# Patient Record
Sex: Female | Born: 1935 | Race: White | Hispanic: No
Health system: Southern US, Community
[De-identification: ages and names within clinical notes are randomized; demographics above are authoritative.]

## PROBLEM LIST (undated history)

## (undated) DIAGNOSIS — N183 Chronic kidney disease, stage 3 unspecified: Secondary | ICD-10-CM

## (undated) DIAGNOSIS — J9621 Acute and chronic respiratory failure with hypoxia: Secondary | ICD-10-CM

## (undated) DIAGNOSIS — J449 Chronic obstructive pulmonary disease, unspecified: Secondary | ICD-10-CM

---

## 2021-01-21 ENCOUNTER — Other Ambulatory Visit (HOSPITAL_COMMUNITY): Payer: Medicare Other

## 2021-01-21 ENCOUNTER — Inpatient Hospital Stay
Admit: 2021-01-21 | Discharge: 2021-02-22 | Disposition: A | Discharge status: Skilled Nursing Facility | Payer: Medicare Other | Source: Ambulatory Visit | Attending: Internal Medicine | Admitting: Internal Medicine

## 2021-01-21 DIAGNOSIS — R109 Unspecified abdominal pain: Secondary | ICD-10-CM

## 2021-01-21 DIAGNOSIS — D72829 Elevated white blood cell count, unspecified: Secondary | ICD-10-CM

## 2021-01-21 DIAGNOSIS — Z452 Encounter for adjustment and management of vascular access device: Secondary | ICD-10-CM

## 2021-01-21 DIAGNOSIS — J9621 Acute and chronic respiratory failure with hypoxia: Secondary | ICD-10-CM | POA: Diagnosis present

## 2021-01-21 DIAGNOSIS — N179 Acute kidney failure, unspecified: Secondary | ICD-10-CM

## 2021-01-21 DIAGNOSIS — J449 Chronic obstructive pulmonary disease, unspecified: Secondary | ICD-10-CM | POA: Diagnosis present

## 2021-01-21 DIAGNOSIS — Z4659 Encounter for fitting and adjustment of other gastrointestinal appliance and device: Secondary | ICD-10-CM

## 2021-01-21 DIAGNOSIS — N183 Chronic kidney disease, stage 3 unspecified: Secondary | ICD-10-CM | POA: Diagnosis present

## 2021-01-21 HISTORY — DX: Chronic kidney disease, stage 3 unspecified: N18.30

## 2021-01-21 HISTORY — DX: Traumatic subdural hemorrhage with loss of consciousness of any duration with death due to brain injury before regaining consciousness, initial encounter: S06.5X7A

## 2021-01-21 HISTORY — DX: Chronic obstructive pulmonary disease, unspecified: J44.9

## 2021-01-21 HISTORY — DX: Acute and chronic respiratory failure with hypoxia: J96.21

## 2021-01-22 LAB — COMPREHENSIVE METABOLIC PANEL
ALT: 21 U/L (ref 0–44)
AST: 20 U/L (ref 15–41)
Albumin: 2.6 g/dL — ABNORMAL LOW (ref 3.5–5.0)
Alkaline Phosphatase: 42 U/L (ref 38–126)
Anion gap: 8 (ref 5–15)
BUN: 41 mg/dL — ABNORMAL HIGH (ref 8–23)
CO2: 32 mmol/L (ref 22–32)
Calcium: 9.8 mg/dL (ref 8.9–10.3)
Chloride: 104 mmol/L (ref 98–111)
Creatinine, Ser: 0.74 mg/dL (ref 0.44–1.00)
GFR, Estimated: >60 mL/min (ref >60)
Glucose, Bld: 108 mg/dL — ABNORMAL HIGH (ref 70–99)
Potassium: 3.8 mmol/L (ref 3.5–5.1)
Sodium: 144 mmol/L (ref 135–145)
Total Bilirubin: 0.7 mg/dL (ref 0.3–1.2)
Total Protein: 5.6 g/dL — ABNORMAL LOW (ref 6.5–8.1)

## 2021-01-22 LAB — CBC
HCT: 26.7 % — ABNORMAL LOW (ref 36.0–46.0)
Hemoglobin: 8.2 g/dL — ABNORMAL LOW (ref 12.0–15.0)
MCH: 28.9 pg (ref 26.0–34.0)
MCHC: 30.7 g/dL (ref 30.0–36.0)
MCV: 94 fL (ref 80.0–100.0)
Platelets: 185 10*3/uL (ref 150–400)
RBC: 2.84 MIL/uL — ABNORMAL LOW (ref 3.87–5.11)
RDW: 15.1 % (ref 11.5–15.5)
WBC: 6.7 10*3/uL (ref 4.0–10.5)
nRBC: 0 % (ref 0.0–0.2)

## 2021-01-22 LAB — PROTIME-INR
INR: 1.1 (ref 0.8–1.2)
Prothrombin Time: 14.2 seconds (ref 11.4–15.2)

## 2021-01-22 LAB — BLOOD GAS, ARTERIAL
Acid-Base Excess: 6.7 mmol/L — ABNORMAL HIGH (ref 0.0–2.0)
Bicarbonate: 31.4 mmol/L — ABNORMAL HIGH (ref 20.0–28.0)
FIO2: 40
O2 Saturation: 98.8 %
Patient temperature: 37
pCO2 arterial: 50.4 mmHg — ABNORMAL HIGH (ref 32.0–48.0)
pH, Arterial: 7.41 (ref 7.350–7.450)
pO2, Arterial: 124 mmHg — ABNORMAL HIGH (ref 83.0–108.0)

## 2021-01-22 MED ORDER — IOHEXOL 180 MG/ML  SOLN
50.0000 mL | Freq: Once | INTRAMUSCULAR | Status: AC | PRN
  Administered 2021-01-22: 50 mL

## 2021-01-23 ENCOUNTER — Encounter: Payer: Self-pay | Admitting: Internal Medicine

## 2021-01-23 DIAGNOSIS — J449 Chronic obstructive pulmonary disease, unspecified: Secondary | ICD-10-CM | POA: Diagnosis not present

## 2021-01-23 DIAGNOSIS — J9621 Acute and chronic respiratory failure with hypoxia: Secondary | ICD-10-CM | POA: Diagnosis not present

## 2021-01-23 DIAGNOSIS — N183 Chronic kidney disease, stage 3 unspecified: Secondary | ICD-10-CM | POA: Diagnosis not present

## 2021-01-23 NOTE — Consult Note (Signed)
Pulmonary Critical Care Medicine [facility] [place]  PULMONARY SERVICE  Date of Service: 01/23/2021  PULMONARY CRITICAL CARE CONSULT   Katie Wade  MCN:[id number]  DOB: 10/08/1933   DOA: 01/21/2021  Referring Physician: Carron Curie, MD  HPI: Katie Wade is a 87 y.o. female seen for follow up of Acute on Chronic Respiratory Failure.  Patient has multiple medical problems as noted below presented to the hospital after she was found to have a large right-sided subdural hemorrhage after sustaining a fall.  Patient had apparently been on Xarelto for anticoagulation and fell in the morning but at that time looked okay in the afternoon however patient was not able to be aroused and therefore ambulance was called.  Patient was admitted into the surgical ICU neurosurgery was consultative taken to the OR had a craniotomy done and evacuation of subdural hematoma.  Postoperatively patient had a prolonged and complicated course requiring ongoing mechanical ventilation.  Patient remained intubated and subsequently had a tracheostomy done for failure to wean.  Now patient presents to our facility for further management and weaning is on pressure support mode at the time of evaluation.  Review of Systems:  ROS performed and is unremarkable other than noted above.  Past Medical History:  Diagnosis Date  . Arthritis  . Asthma  . Breast cancer (HCC)  left breast  . Breast cancer (HCC)  . CHF (congestive heart failure) (HCC)  . COPD (chronic obstructive pulmonary disease) (HCC)  2 liters O2 at night/ and PRN  . Depression  . Diabetes mellitus (HCC)  diet controlled  . Dysthymic disorder  . Hiatal hernia  . History of cancer chemotherapy 2015  could not tolerate  . History of complications due to general anesthesia  Harder time/longer to wake up with breast biopsy (patient's grandson mentions propofol)  . History of radiation therapy  breast left  . Hx of hiatal hernia  .  Hypertension  . Hypothyroidism  . Lower back pain  LEFT lumbar fracture at L1-2 per patient family  . Lower extremity neuropathy  . Obstructive sleep apnea  O2 at night at 2 liters, cannot tolerate CPAP mask  . Peripheral neuropathy  . Polyneuropathy  . Post-operative nausea and vomiting  . PUD (peptic ulcer disease)  . Thromboembolism (HCC) 1968  with pregnancy- chest   Past Surgical History:  Procedure Laterality Date  . HX BREAST LUMPECTOMY FOR CANCER Left 2015  . HX BREAST SURGERY - OTHER Left  biospy  . HX CATARACT REMOVAL Bilateral  . HX CORE NEEDLE BREAST BIOPSY  . HX EXCISIONAL BIOPSY  . HX HYSTERECTOMY  . HX KNEE REPLACMENT Right  . HX SPINAL SURGERY  1960  . HX SURGICAL OTHER  Left shoulder, right knee, Biliteral eyes  . HX SURGICAL OTHER left arm due to breast cancer  . HX THYROIDECTOMY 1960   (Not in a hospital admission)  Allergies  Allergen Reactions  . Codeine Other (See Comments)  Clarified allergy with patient and family. It's unclear what the codeine reaction was, but she has tolerated hydrocodone, morphine and hydromorphone without incident.  . Nitrofurantoin Monohyd/M-Cryst Confusion, Fever, Hallucination, Muscle Pain, Nausea and Vomiting and Syncope   Social History   Tobacco Use  . Smoking status: Never Smoker  . Smokeless tobacco: Never Used  Substance Use Topics  . Alcohol use: No   Family History  Problem Relation Name Age of Onset  . Cancer-Breast Mother  around her 17's  . Diabetes Mother  . COPD  Grandchild       Medications: Reviewed on Rounds  Physical Exam:  Vitals: Temperature is 98.6 pulse 58 respiratory 21 blood pressure is 152/72 saturations 99%  Ventilator Settings patient currently is on pressure support wean pressure support of 12/5 FiO2 40%  . General: Comfortable at this time . Eyes: Grossly normal lids, irises & conjunctiva . ENT: grossly tongue is normal . Neck: no obvious mass . Cardiovascular: S1-S2  normal no gallop or rub . Respiratory: Scattered rhonchi expansion is equal . Abdomen: Soft and nontender . Skin: no rash seen on limited exam . Musculoskeletal: not rigid . Psychiatric:unable to assess . Neurologic: no seizure no involuntary movements         Labs on Admission:  Basic Metabolic Panel: Recent Labs  Lab 01/22/21 1350  NA 144  K 3.8  CL 104  CO2 32  GLUCOSE 108*  BUN 41*  CREATININE 0.74  CALCIUM 9.8    Recent Labs  Lab 01/22/21 0415  PHART 7.410  PCO2ART 50.4*  PO2ART 124*  HCO3 31.4*  O2SAT 98.8    Liver Function Tests: Recent Labs  Lab 01/22/21 1350  AST 20  ALT 21  ALKPHOS 42  BILITOT 0.7  PROT 5.6*  ALBUMIN 2.6*   No results for input(s): LIPASE, AMYLASE in the last 168 hours. No results for input(s): AMMONIA in the last 168 hours.  CBC: Recent Labs  Lab 01/22/21 1350  WBC 6.7  HGB 8.2*  HCT 26.7*  MCV 94.0  PLT 185    Cardiac Enzymes: No results for input(s): CKTOTAL, CKMB, CKMBINDEX, TROPONINI in the last 168 hours.  BNP (last 3 results) No results for input(s): BNP in the last 8760 hours.  ProBNP (last 3 results) No results for input(s): PROBNP in the last 8760 hours.   Radiological Exams on Admission: DG ABDOMEN PEG TUBE LOCATION  Result Date: 01/22/2021 CLINICAL DATA:  Peg tube placement EXAM: ABDOMEN - 1 VIEW COMPARISON:  None. FINDINGS: Frontal view of the lower chest and upper abdomen excludes the pelvis and right flank by collimation. Percutaneous gastrostomy tube overlies the left upper quadrant. Contrast administered by the clinician outlines the catheter tubing and the gastric rugal folds within the fundus. No evidence of contrast extravasation. No bowel obstruction. IMPRESSION: 1. Percutaneous gastrostomy tube within the gastric lumen. No contrast extravasation. Electronically Signed   By: Sharlet Salina M.D.   On: 01/22/2021 01:16   DG CHEST PORT 1 VIEW  Result Date: 01/22/2021 CLINICAL DATA:  PICC line  EXAM: PORTABLE CHEST 1 VIEW COMPARISON:  None. FINDINGS: Single frontal view of the chest demonstrates tracheostomy tube overlying tracheal air column tip at level of thoracic inlet. There is a right-sided PICC tip overlying the superior vena cava. Cardiac silhouette is unremarkable. No airspace disease, effusion, or pneumothorax. Surgical clips left axilla. IMPRESSION: 1. Right-sided PICC tip overlying superior vena cava. 2. No acute intrathoracic process. Electronically Signed   By: Sharlet Salina M.D.   On: 01/22/2021 01:17    Assessment/Plan Active Problems:   Acute on chronic respiratory failure with hypoxia (HCC)   Traumatic subdural hematoma with loss of consciousness of any duration with death due to brain injury before regaining consciousness   COPD, severe (HCC)   Chronic kidney disease, stage III (moderate) (HCC)   1. Acute on chronic respiratory failure hypoxia the plan is going to be to continue with the pressure support wean on 12/5.  The goal is for 4 hours at this time.  Patient appears  to be tolerating it fairly well. 2. Traumatic subdural hematoma patient is going to be followed by physical therapy will continue with therapy as tolerated. 3. Severe COPD medical management patient will need nebulizers as necessary. 4. Chronic kidney disease stage III moderate by severity plan is going to be to monitor the patient's fluid status closely.  I have personally seen and evaluated the patient, evaluated laboratory and imaging results, formulated the assessment and plan and placed orders. The Patient requires high complexity decision making with multiple systems involvement.  Case was discussed on Rounds with the Respiratory Therapy Director and the Respiratory staff Time Spent  Yevonne Pax, MD [facility] Pulmonary Critical Care Medicine Sleep Medicine

## 2021-01-24 DIAGNOSIS — J449 Chronic obstructive pulmonary disease, unspecified: Secondary | ICD-10-CM | POA: Diagnosis not present

## 2021-01-24 DIAGNOSIS — N183 Chronic kidney disease, stage 3 unspecified: Secondary | ICD-10-CM | POA: Diagnosis not present

## 2021-01-24 DIAGNOSIS — J9621 Acute and chronic respiratory failure with hypoxia: Secondary | ICD-10-CM | POA: Diagnosis not present

## 2021-01-24 LAB — BASIC METABOLIC PANEL
Anion gap: 9 (ref 5–15)
BUN: 45 mg/dL — ABNORMAL HIGH (ref 8–23)
CO2: 31 mmol/L (ref 22–32)
Calcium: 10.3 mg/dL (ref 8.9–10.3)
Chloride: 102 mmol/L (ref 98–111)
Creatinine, Ser: 0.58 mg/dL (ref 0.44–1.00)
GFR, Estimated: >60 mL/min (ref >60)
Glucose, Bld: 125 mg/dL — ABNORMAL HIGH (ref 70–99)
Potassium: 4.1 mmol/L (ref 3.5–5.1)
Sodium: 142 mmol/L (ref 135–145)

## 2021-01-24 LAB — CBC
HCT: 27.4 % — ABNORMAL LOW (ref 36.0–46.0)
Hemoglobin: 8.2 g/dL — ABNORMAL LOW (ref 12.0–15.0)
MCH: 28.3 pg (ref 26.0–34.0)
MCHC: 29.9 g/dL — ABNORMAL LOW (ref 30.0–36.0)
MCV: 94.5 fL (ref 80.0–100.0)
Platelets: 173 10*3/uL (ref 150–400)
RBC: 2.9 MIL/uL — ABNORMAL LOW (ref 3.87–5.11)
RDW: 15.2 % (ref 11.5–15.5)
WBC: 7 10*3/uL (ref 4.0–10.5)
nRBC: 0 % (ref 0.0–0.2)

## 2021-01-24 NOTE — Progress Notes (Signed)
Pulmonary Critical Care Medicine [facility] [place]   PULMONARY CRITICAL CARE SERVICE  PROGRESS NOTE  Date of Service: 01/24/2021  Katie Wade  JME:[id number]  DOB: [birth year]   DOA: 01/21/2021  Referring Physician: Carron Curie, MD  HPI: Katie Wade is a 87 y.o. female seen for follow up of Acute on Chronic Respiratory Failure.  Weaning on protocol today is 8-hour goal on pressure support  Medications: Reviewed on Rounds  Physical Exam:  Vitals: Temperature is 98.6 pulse 77 respiratory rate is 18 blood pressure 153/79 saturations 100%  Ventilator Settings on pressure support FiO2 40% pressure 12/5  . General: Comfortable at this time . Eyes: Grossly normal lids, irises & conjunctiva . ENT: grossly tongue is normal . Neck: no obvious mass . Cardiovascular: S1 S2 normal no gallop . Respiratory: No rhonchi very coarse breath sounds . Abdomen: soft . Skin: no rash seen on limited exam . Musculoskeletal: not rigid . Psychiatric:unable to assess . Neurologic: no seizure no involuntary movements         Lab Data:   Basic Metabolic Panel: Recent Labs  Lab 01/22/21 1350 01/24/21 0800  NA 144 142  K 3.8 4.1  CL 104 102  CO2 32 31  GLUCOSE 108* 125*  BUN 41* 45*  CREATININE 0.74 0.58  CALCIUM 9.8 10.3    ABG: Recent Labs  Lab 01/22/21 0415  PHART 7.410  PCO2ART 50.4*  PO2ART 124*  HCO3 31.4*  O2SAT 98.8    Liver Function Tests: Recent Labs  Lab 01/22/21 1350  AST 20  ALT 21  ALKPHOS 42  BILITOT 0.7  PROT 5.6*  ALBUMIN 2.6*   No results for input(s): LIPASE, AMYLASE in the last 168 hours. No results for input(s): AMMONIA in the last 168 hours.  CBC: Recent Labs  Lab 01/22/21 1350 01/24/21 0800  WBC 6.7 7.0  HGB 8.2* 8.2*  HCT 26.7* 27.4*  MCV 94.0 94.5  PLT 185 173    Cardiac Enzymes: No results for input(s): CKTOTAL, CKMB, CKMBINDEX, TROPONINI in the last 168 hours.  BNP (last 3 results) No results for input(s):  BNP in the last 8760 hours.  ProBNP (last 3 results) No results for input(s): PROBNP in the last 8760 hours.  Radiological Exams: No results found.  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 Acute on chronic respiratory failure hypoxia we will continue with pressure support wean as tolerated. 2. Traumatic subdural hematoma no change we will continue to monitor 3. Severe COPD medical management 4. Chronic kidney disease stage III we will continue with supportive care   I have personally seen and evaluated the patient, evaluated laboratory and imaging results, formulated the assessment and plan and placed orders. The Patient requires high complexity decision making with multiple systems involvement.  Rounds were done with the Respiratory Therapy Director and Staff therapists and discussed with nursing staff also.  Yevonne Pax, MD [facility] Pulmonary Critical Care Medicine Sleep Medicine

## 2021-01-25 DIAGNOSIS — J449 Chronic obstructive pulmonary disease, unspecified: Secondary | ICD-10-CM | POA: Diagnosis not present

## 2021-01-25 DIAGNOSIS — N183 Chronic kidney disease, stage 3 unspecified: Secondary | ICD-10-CM | POA: Diagnosis not present

## 2021-01-25 DIAGNOSIS — J9621 Acute and chronic respiratory failure with hypoxia: Secondary | ICD-10-CM | POA: Diagnosis not present

## 2021-01-26 DIAGNOSIS — J9621 Acute and chronic respiratory failure with hypoxia: Secondary | ICD-10-CM | POA: Diagnosis not present

## 2021-01-26 DIAGNOSIS — N183 Chronic kidney disease, stage 3 unspecified: Secondary | ICD-10-CM | POA: Diagnosis not present

## 2021-01-26 DIAGNOSIS — J449 Chronic obstructive pulmonary disease, unspecified: Secondary | ICD-10-CM | POA: Diagnosis not present

## 2021-01-26 NOTE — Progress Notes (Signed)
Pulmonary Critical Care Medicine [facility] [place]   PULMONARY CRITICAL CARE SERVICE  PROGRESS NOTE  Date of Service: 01/26/2021   Katie Wade  OXB:[id number]  DOB: 04/20/1933   DOA: 01/21/2021  Referring Physician: Carron Curie, MD  HPI: Katie Wade is a 87 y.o. female seen for follow up of Acute on Chronic Respiratory Failure.  Patient is on pressure support goal of 16 hours  Medications: Reviewed on Rounds  Physical Exam:  Vitals: Temperature is 98.7 pulse 59 respiratory rate is 14 blood pressure is 133/65 saturations 100%  Ventilator Settings on pressure support FiO2 40% pressure 12/5  . General: Comfortable at this time . Eyes: Grossly normal lids, irises & conjunctiva . ENT: grossly tongue is normal . Neck: no obvious mass . Cardiovascular: S1 S2 normal no gallop . Respiratory: No rhonchi no rales are noted at this time . Abdomen: soft . Skin: no rash seen on limited exam . Musculoskeletal: not rigid . Psychiatric:unable to assess . Neurologic: no seizure no involuntary movements         Lab Data:   Basic Metabolic Panel: Recent Labs  Lab 01/22/21 1350 01/24/21 0800  NA 144 142  K 3.8 4.1  CL 104 102  CO2 32 31  GLUCOSE 108* 125*  BUN 41* 45*  CREATININE 0.74 0.58  CALCIUM 9.8 10.3    ABG: Recent Labs  Lab 01/22/21 0415  PHART 7.410  PCO2ART 50.4*  PO2ART 124*  HCO3 31.4*  O2SAT 98.8    Liver Function Tests: Recent Labs  Lab 01/22/21 1350  AST 20  ALT 21  ALKPHOS 42  BILITOT 0.7  PROT 5.6*  ALBUMIN 2.6*   No results for input(s): LIPASE, AMYLASE in the last 168 hours. No results for input(s): AMMONIA in the last 168 hours.  CBC: Recent Labs  Lab 01/22/21 1350 01/24/21 0800  WBC 6.7 7.0  HGB 8.2* 8.2*  HCT 26.7* 27.4*  MCV 94.0 94.5  PLT 185 173    Cardiac Enzymes: No results for input(s): CKTOTAL, CKMB, CKMBINDEX, TROPONINI in the last 168 hours.  BNP (last 3 results) No results for input(s):  BNP in the last 8760 hours.  ProBNP (last 3 results) No results for input(s): PROBNP in the last 8760 hours.  Radiological Exams: No results found.  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 Acute on chronic respiratory failure hypoxia we will continue with pressure support titrate oxygen continue pulmonary toilet. 2. Traumatic subdural hematoma no change we will continue to monitor closely 3. Severe COPD at baseline 4. Chronic kidney disease stage III we will continue with supportive care   I have personally seen and evaluated the patient, evaluated laboratory and imaging results, formulated the assessment and plan and placed orders. The Patient requires high complexity decision making with multiple systems involvement.  Rounds were done with the Respiratory Therapy Director and Staff therapists and discussed with nursing staff also.  Yevonne Pax, MD [facility] Pulmonary Critical Care Medicine Sleep Medicine

## 2021-01-26 NOTE — Progress Notes (Signed)
Pulmonary Critical Care Medicine [facility] [place]   PULMONARY CRITICAL CARE SERVICE  PROGRESS NOTE    Katie Wade  IDP:[id number]  DOB: [birth year]   DOA: 01/21/2021  Referring Physician: Carron Curie, MD  HPI: Katie Wade is a 87 y.o. female seen for follow up of Acute on Chronic Respiratory Failure.  Patient is on full support on assist control has been on 40% FiO2  Medications: Reviewed on Rounds  Physical Exam:  Vitals: Temperature is 97.8 pulse 50 respiratory rate is 16 blood pressure is 136/64 saturations 99%  Ventilator Settings on assist control FiO2 40% tidal volume 450 PEEP 5  . General: Comfortable at this time . Eyes: Grossly normal lids, irises & conjunctiva . ENT: grossly tongue is normal . Neck: no obvious mass . Cardiovascular: S1 S2 normal no gallop . Respiratory: Scattered rhonchi expansion is equal at this time . Abdomen: soft . Skin: no rash seen on limited exam . Musculoskeletal: not rigid . Psychiatric:unable to assess . Neurologic: no seizure no involuntary movements         Lab Data:   Basic Metabolic Panel: Recent Labs  Lab 01/22/21 1350 01/24/21 0800  NA 144 142  K 3.8 4.1  CL 104 102  CO2 32 31  GLUCOSE 108* 125*  BUN 41* 45*  CREATININE 0.74 0.58  CALCIUM 9.8 10.3    ABG: Recent Labs  Lab 01/22/21 0415  PHART 7.410  PCO2ART 50.4*  PO2ART 124*  HCO3 31.4*  O2SAT 98.8    Liver Function Tests: Recent Labs  Lab 01/22/21 1350  AST 20  ALT 21  ALKPHOS 42  BILITOT 0.7  PROT 5.6*  ALBUMIN 2.6*   No results for input(s): LIPASE, AMYLASE in the last 168 hours. No results for input(s): AMMONIA in the last 168 hours.  CBC: Recent Labs  Lab 01/22/21 1350 01/24/21 0800  WBC 6.7 7.0  HGB 8.2* 8.2*  HCT 26.7* 27.4*  MCV 94.0 94.5  PLT 185 173    Cardiac Enzymes: No results for input(s): CKTOTAL, CKMB, CKMBINDEX, TROPONINI in the last 168 hours.  BNP (last 3 results) No results for  input(s): BNP in the last 8760 hours.  ProBNP (last 3 results) No results for input(s): PROBNP in the last 8760 hours.  Radiological Exams: No results found.  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 Acute on chronic respiratory failure hypoxia plan is to continue with full support on the ventilator.  Right now not tolerating weaning respiratory therapy will check the RSB I mechanics. 2. Traumatic subdural hematoma no change we will continue with supportive care. 3. Severe COPD medical management 4. Chronic kidney disease stage III following lab   I have personally seen and evaluated the patient, evaluated laboratory and imaging results, formulated the assessment and plan and placed orders. The Patient requires high complexity decision making with multiple systems involvement.  Rounds were done with the Respiratory Therapy Director and Staff therapists and discussed with nursing staff also.  Yevonne Pax, MD [facility] Pulmonary Critical Care Medicine Sleep Medicine

## 2021-01-27 DIAGNOSIS — J9621 Acute and chronic respiratory failure with hypoxia: Secondary | ICD-10-CM | POA: Diagnosis not present

## 2021-01-27 DIAGNOSIS — J449 Chronic obstructive pulmonary disease, unspecified: Secondary | ICD-10-CM | POA: Diagnosis not present

## 2021-01-27 DIAGNOSIS — N183 Chronic kidney disease, stage 3 unspecified: Secondary | ICD-10-CM | POA: Diagnosis not present

## 2021-01-27 NOTE — Progress Notes (Signed)
Pulmonary Critical Care Medicine [facility] [place]   PULMONARY CRITICAL CARE SERVICE  PROGRESS NOTE  Date of Service: 01/27/2021   Katie Wade  FAO:[id number]  DOB: 1932-12-12   DOA: 01/21/2021  Referring Physician: Carron Curie, MD  HPI: Katie Wade is a 87 y.o. female seen for follow up of Acute on Chronic Respiratory Failure.  Patient is currently afebrile without distress pressure support supposed to wean on T collar today  Medications: Reviewed on Rounds  Physical Exam:  Vitals: Temperature is 98.5 pulse 64 respiratory 15 blood pressure is 107/48 saturations 100%  Ventilator Settings on T collar right now  . General: Comfortable at this time . Eyes: Grossly normal lids, irises & conjunctiva . ENT: grossly tongue is normal . Neck: no obvious mass . Cardiovascular: S1 S2 normal no gallop . Respiratory: No rhonchi very coarse breath sounds . Abdomen: soft . Skin: no rash seen on limited exam . Musculoskeletal: not rigid . Psychiatric:unable to assess . Neurologic: no seizure no involuntary movements         Lab Data:   Basic Metabolic Panel: Recent Labs  Lab 01/22/21 1350 01/24/21 0800  NA 144 142  K 3.8 4.1  CL 104 102  CO2 32 31  GLUCOSE 108* 125*  BUN 41* 45*  CREATININE 0.74 0.58  CALCIUM 9.8 10.3    ABG: Recent Labs  Lab 01/22/21 0415  PHART 7.410  PCO2ART 50.4*  PO2ART 124*  HCO3 31.4*  O2SAT 98.8    Liver Function Tests: Recent Labs  Lab 01/22/21 1350  AST 20  ALT 21  ALKPHOS 42  BILITOT 0.7  PROT 5.6*  ALBUMIN 2.6*   No results for input(s): LIPASE, AMYLASE in the last 168 hours. No results for input(s): AMMONIA in the last 168 hours.  CBC: Recent Labs  Lab 01/22/21 1350 01/24/21 0800  WBC 6.7 7.0  HGB 8.2* 8.2*  HCT 26.7* 27.4*  MCV 94.0 94.5  PLT 185 173    Cardiac Enzymes: No results for input(s): CKTOTAL, CKMB, CKMBINDEX, TROPONINI in the last 168 hours.  BNP (last 3 results) No results  for input(s): BNP in the last 8760 hours.  ProBNP (last 3 results) No results for input(s): PROBNP in the last 8760 hours.  Radiological Exams: No results found.  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 Acute on chronic respiratory failure hypoxia plan is to continue with the T-piece titrate oxygen as tolerated continue pulmonary toilet. 2. Traumatic subarachnoid hemorrhage and hematoma no change 3. Severe COPD medical management 4. Chronic kidney disease stage III we will continue to monitor   I have personally seen and evaluated the patient, evaluated laboratory and imaging results, formulated the assessment and plan and placed orders. The Patient requires high complexity decision making with multiple systems involvement.  Rounds were done with the Respiratory Therapy Director and Staff therapists and discussed with nursing staff also.  Yevonne Pax, MD [facility] Pulmonary Critical Care Medicine Sleep Medicine

## 2021-01-28 LAB — BASIC METABOLIC PANEL
Anion gap: 5 (ref 5–15)
BUN: 41 mg/dL — ABNORMAL HIGH (ref 8–23)
CO2: 38 mmol/L — ABNORMAL HIGH (ref 22–32)
Calcium: 10.8 mg/dL — ABNORMAL HIGH (ref 8.9–10.3)
Chloride: 99 mmol/L (ref 98–111)
Creatinine, Ser: 0.51 mg/dL (ref 0.44–1.00)
GFR, Estimated: >60 mL/min (ref >60)
Glucose, Bld: 99 mg/dL (ref 70–99)
Potassium: 4.5 mmol/L (ref 3.5–5.1)
Sodium: 142 mmol/L (ref 135–145)

## 2021-01-28 LAB — CBC
HCT: 31.6 % — ABNORMAL LOW (ref 36.0–46.0)
Hemoglobin: 9.8 g/dL — ABNORMAL LOW (ref 12.0–15.0)
MCH: 28.5 pg (ref 26.0–34.0)
MCHC: 31 g/dL (ref 30.0–36.0)
MCV: 91.9 fL (ref 80.0–100.0)
Platelets: 205 10*3/uL (ref 150–400)
RBC: 3.44 MIL/uL — ABNORMAL LOW (ref 3.87–5.11)
RDW: 15 % (ref 11.5–15.5)
WBC: 6.7 10*3/uL (ref 4.0–10.5)
nRBC: 0 % (ref 0.0–0.2)

## 2021-01-28 LAB — MAGNESIUM: Magnesium: 1.9 mg/dL (ref 1.7–2.4)

## 2021-01-29 DIAGNOSIS — J9621 Acute and chronic respiratory failure with hypoxia: Secondary | ICD-10-CM

## 2021-01-29 DIAGNOSIS — N183 Chronic kidney disease, stage 3 unspecified: Secondary | ICD-10-CM | POA: Diagnosis not present

## 2021-01-29 DIAGNOSIS — J449 Chronic obstructive pulmonary disease, unspecified: Secondary | ICD-10-CM | POA: Diagnosis not present

## 2021-01-29 NOTE — Progress Notes (Addendum)
Pulmonary Critical Care Medicine [facility] [place]   PULMONARY CRITICAL CARE SERVICE  PROGRESS NOTE     Katie Wade  [name]  DOB: 07/17/1933   DOA: 01/21/2021  Referring Physician: Carron Curie, MD  HPI: Katie Wade is a 87 y.o. female seen for follow up of Acute on Chronic Respiratory Failure.  Patient is comfortable right now without distress no fevers are noted overnight  Medications: Reviewed on Rounds  Physical Exam:  Vitals: Temperature is 98.7 pulse 95 respiratory 30 blood pressure is 144/67 saturations 100%  Ventilator Settings off ventilator on T collar FiO2 35  . General: Comfortable at this time . Eyes: Grossly normal lids, irises & conjunctiva . ENT: grossly tongue is normal . Neck: no obvious mass . Cardiovascular: S1 S2 normal no gallop . Respiratory: No rhonchi very coarse breath sounds . Abdomen: soft . Skin: no rash seen on limited exam . Musculoskeletal: not rigid . Psychiatric:unable to assess . Neurologic: no seizure no involuntary movements         Lab Data:   Basic Metabolic Panel: Recent Labs  Lab 01/22/21 1350 01/24/21 0800 01/28/21 0400  NA 144 142 142  K 3.8 4.1 4.5  CL 104 102 99  CO2 32 31 38*  GLUCOSE 108* 125* 99  BUN 41* 45* 41*  CREATININE 0.74 0.58 0.51  CALCIUM 9.8 10.3 10.8*  MG  --   --  1.9    ABG: No results for input(s): PHART, PCO2ART, PO2ART, HCO3, O2SAT in the last 168 hours.  Liver Function Tests: Recent Labs  Lab 01/22/21 1350  AST 20  ALT 21  ALKPHOS 42  BILITOT 0.7  PROT 5.6*  ALBUMIN 2.6*   No results for input(s): LIPASE, AMYLASE in the last 168 hours. No results for input(s): AMMONIA in the last 168 hours.  CBC: Recent Labs  Lab 01/22/21 1350 01/24/21 0800 01/28/21 0400  WBC 6.7 7.0 6.7  HGB 8.2* 8.2* 9.8*  HCT 26.7* 27.4* 31.6*  MCV 94.0 94.5 91.9  PLT 185 173 205    Cardiac Enzymes: No results for input(s): CKTOTAL, CKMB, CKMBINDEX, TROPONINI in the  last 168 hours.  BNP (last 3 results) No results for input(s): BNP in the last 8760 hours.  ProBNP (last 3 results) No results for input(s): PROBNP in the last 8760 hours.  Radiological Exams: No results found.  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 Acute on chronic respiratory failure hypoxia we will continue with the T collar goal of 4 hours. 2. Traumatic subdural hematoma no change supportive care 3. Severe COPD medical management 4. Chronic kidney disease stage III supportive care   I have personally seen and evaluated the patient, evaluated laboratory and imaging results, formulated the assessment and plan and placed orders. The Patient requires high complexity decision making with multiple systems involvement.  Rounds were done with the Respiratory Therapy Director and Staff therapists and discussed with nursing staff also.  Yevonne Pax, MD [facility] Pulmonary Critical Care Medicine Sleep Medicine

## 2021-01-29 NOTE — Progress Notes (Signed)
Pulmonary Critical Care Medicine [facility] [place]   PULMONARY CRITICAL CARE SERVICE  PROGRESS NOTE  Date of Service: 01/29/2021   Katie Wade  XBJ:[id number]  DOB: March 02, 1933   DOA: 01/21/2021  Referring Physician: Carron Curie, MD  HPI: JASHAWNA REEVER is a 87 y.o. female seen for follow up of Acute on Chronic Respiratory Failure.  Patient remains afebrile without distress at this time  Medications: Reviewed on Rounds  Physical Exam:  Vitals: Temperature is 98.0 pulse 89 respiratory 26 blood pressure is 147/82 saturations 98%  Ventilator Settings on T collar with an FiO2 of 35%  . General: Comfortable at this time . Eyes: Grossly normal lids, irises & conjunctiva . ENT: grossly tongue is normal . Neck: no obvious mass . Cardiovascular: S1 S2 normal no gallop . Respiratory: Scattered rhonchi very coarse breath . Abdomen: soft . Skin: no rash seen on limited exam . Musculoskeletal: not rigid . Psychiatric:unable to assess . Neurologic: no seizure no involuntary movements         Lab Data:   Basic Metabolic Panel: Recent Labs  Lab 01/22/21 1350 01/24/21 0800 01/28/21 0400  NA 144 142 142  K 3.8 4.1 4.5  CL 104 102 99  CO2 32 31 38*  GLUCOSE 108* 125* 99  BUN 41* 45* 41*  CREATININE 0.74 0.58 0.51  CALCIUM 9.8 10.3 10.8*  MG  --   --  1.9    ABG: No results for input(s): PHART, PCO2ART, PO2ART, HCO3, O2SAT in the last 168 hours.  Liver Function Tests: Recent Labs  Lab 01/22/21 1350  AST 20  ALT 21  ALKPHOS 42  BILITOT 0.7  PROT 5.6*  ALBUMIN 2.6*   No results for input(s): LIPASE, AMYLASE in the last 168 hours. No results for input(s): AMMONIA in the last 168 hours.  CBC: Recent Labs  Lab 01/22/21 1350 01/24/21 0800 01/28/21 0400  WBC 6.7 7.0 6.7  HGB 8.2* 8.2* 9.8*  HCT 26.7* 27.4* 31.6*  MCV 94.0 94.5 91.9  PLT 185 173 205    Cardiac Enzymes: No results for input(s): CKTOTAL, CKMB, CKMBINDEX, TROPONINI in the  last 168 hours.  BNP (last 3 results) No results for input(s): BNP in the last 8760 hours.  ProBNP (last 3 results) No results for input(s): PROBNP in the last 8760 hours.  Radiological Exams: No results found.  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 Acute on chronic respiratory failure hypoxia patient currently is on T collar with an FiO2 of 35% goal of 8 hours 2. Traumatic subdural hematoma no change we will continue to monitor 3. Severe COPD medical management 4. Chronic kidney disease at baseline   I have personally seen and evaluated the patient, evaluated laboratory and imaging results, formulated the assessment and plan and placed orders. The Patient requires high complexity decision making with multiple systems involvement.  Rounds were done with the Respiratory Therapy Director and Staff therapists and discussed with nursing staff also.  Yevonne Pax, MD [facility] Pulmonary Critical Care Medicine Sleep Medicine

## 2021-01-30 DIAGNOSIS — J9621 Acute and chronic respiratory failure with hypoxia: Secondary | ICD-10-CM | POA: Diagnosis not present

## 2021-01-30 DIAGNOSIS — N183 Chronic kidney disease, stage 3 unspecified: Secondary | ICD-10-CM | POA: Diagnosis not present

## 2021-01-30 DIAGNOSIS — J449 Chronic obstructive pulmonary disease, unspecified: Secondary | ICD-10-CM | POA: Diagnosis not present

## 2021-01-30 NOTE — Progress Notes (Signed)
Pulmonary Critical Care Medicine [facility] [place]   PULMONARY CRITICAL CARE SERVICE  PROGRESS NOTE     Katie Wade  WER:[id number]  DOB: Oct 05, 1933   DOA: 01/21/2021  Referring Physician: Carron Curie, MD  HPI: Katie Wade is a 87 y.o. female seen for follow up of Acute on Chronic Respiratory Failure.  Patient is resting comfortably on T collar goal is 12 hours today  Medications: Reviewed on Rounds  Physical Exam:  Vitals: Temperature is 98.6 pulse 83 respiratory 20 blood pressure 70/70 saturations 100%  Ventilator Settings off the ventilator on T collar FiO2 40%  . General: Comfortable at this time . Eyes: Grossly normal lids, irises & conjunctiva . ENT: grossly tongue is normal . Neck: no obvious mass . Cardiovascular: S1 S2 normal no gallop . Respiratory: Scattered rhonchi expansion is equal . Abdomen: soft . Skin: no rash seen on limited exam . Musculoskeletal: not rigid . Psychiatric:unable to assess . Neurologic: no seizure no involuntary movements         Lab Data:   Basic Metabolic Panel: Recent Labs  Lab 01/24/21 0800 01/28/21 0400  NA 142 142  K 4.1 4.5  CL 102 99  CO2 31 38*  GLUCOSE 125* 99  BUN 45* 41*  CREATININE 0.58 0.51  CALCIUM 10.3 10.8*  MG  --  1.9    ABG: No results for input(s): PHART, PCO2ART, PO2ART, HCO3, O2SAT in the last 168 hours.  Liver Function Tests: No results for input(s): AST, ALT, ALKPHOS, BILITOT, PROT, ALBUMIN in the last 168 hours. No results for input(s): LIPASE, AMYLASE in the last 168 hours. No results for input(s): AMMONIA in the last 168 hours.  CBC: Recent Labs  Lab 01/24/21 0800 01/28/21 0400  WBC 7.0 6.7  HGB 8.2* 9.8*  HCT 27.4* 31.6*  MCV 94.5 91.9  PLT 173 205    Cardiac Enzymes: No results for input(s): CKTOTAL, CKMB, CKMBINDEX, TROPONINI in the last 168 hours.  BNP (last 3 results) No results for input(s): BNP in the last 8760 hours.  ProBNP (last 3  results) No results for input(s): PROBNP in the last 8760 hours.  Radiological Exams: No results found.  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 Acute on chronic respiratory failure hypoxia plan is to continue with T-piece titrate oxygen continue pulmonary toilet. 2. Traumatic subdural hematoma no change 3. Severe COPD medical management 4. Chronic kidney disease stage III we will continue to follow along.   I have personally seen and evaluated the patient, evaluated laboratory and imaging results, formulated the assessment and plan and placed orders. The Patient requires high complexity decision making with multiple systems involvement.  Rounds were done with the Respiratory Therapy Director and Staff therapists and discussed with nursing staff also.  Yevonne Pax, MD [facility] Pulmonary Critical Care Medicine Sleep Medicine

## 2021-01-31 DIAGNOSIS — J449 Chronic obstructive pulmonary disease, unspecified: Secondary | ICD-10-CM | POA: Diagnosis not present

## 2021-01-31 DIAGNOSIS — N183 Chronic kidney disease, stage 3 unspecified: Secondary | ICD-10-CM | POA: Diagnosis not present

## 2021-01-31 DIAGNOSIS — J9621 Acute and chronic respiratory failure with hypoxia: Secondary | ICD-10-CM | POA: Diagnosis not present

## 2021-01-31 NOTE — Progress Notes (Signed)
Pulmonary Critical Care Medicine [facility] [place]   PULMONARY CRITICAL CARE SERVICE  PROGRESS NOTE     Katie Wade  QIW:[id number]  DOB: 1933/02/09   DOA: 01/21/2021  Referring Physician: Carron Curie, MD  HPI: Katie Wade is a 87 y.o. female seen for follow up of Acute on Chronic Respiratory Failure.  Patient is currently on T collar has done about 16hours for a goal today of 20 hours  Medications: Reviewed on Rounds  Physical Exam:  Vitals: Temperature 98.6 pulse 89 respiratory 20 blood pressure is 149/79 saturations 99%  Ventilator Settings T collar with an FiO2 28  . General: Comfortable at this time . Eyes: Grossly normal lids, irises & conjunctiva . ENT: grossly tongue is normal . Neck: no obvious mass . Cardiovascular: S1 S2 normal no gallop . Respiratory: No rhonchi no rales are noted at this time . Abdomen: soft . Skin: no rash seen on limited exam . Musculoskeletal: not rigid . Psychiatric:unable to assess . Neurologic: no seizure no involuntary movements         Lab Data:   Basic Metabolic Panel: Recent Labs  Lab 01/28/21 0400  NA 142  K 4.5  CL 99  CO2 38*  GLUCOSE 99  BUN 41*  CREATININE 0.51  CALCIUM 10.8*  MG 1.9    ABG: No results for input(s): PHART, PCO2ART, PO2ART, HCO3, O2SAT in the last 168 hours.  Liver Function Tests: No results for input(s): AST, ALT, ALKPHOS, BILITOT, PROT, ALBUMIN in the last 168 hours. No results for input(s): LIPASE, AMYLASE in the last 168 hours. No results for input(s): AMMONIA in the last 168 hours.  CBC: Recent Labs  Lab 01/28/21 0400  WBC 6.7  HGB 9.8*  HCT 31.6*  MCV 91.9  PLT 205    Cardiac Enzymes: No results for input(s): CKTOTAL, CKMB, CKMBINDEX, TROPONINI in the last 168 hours.  BNP (last 3 results) No results for input(s): BNP in the last 8760 hours.  ProBNP (last 3 results) No results for input(s): PROBNP in the last 8760 hours.  Radiological Exams: No  results found.  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 Acute on chronic respiratory failure with hypoxia plan is going to be to continue with T collar goal of 20 hours 2. Traumatic subdural hematoma no change we will continue to follow along. 3. Severe COPD we will continue with medical management 4. Chronic kidney disease stage III supportive care   I have personally seen and evaluated the patient, evaluated laboratory and imaging results, formulated the assessment and plan and placed orders. The Patient requires high complexity decision making with multiple systems involvement.  Rounds were done with the Respiratory Therapy Director and Staff therapists and discussed with nursing staff also.  Yevonne Pax, MD [facility] Pulmonary Critical Care Medicine Sleep Medicine

## 2021-01-31 NOTE — Progress Notes (Signed)
  Patient is s/p PEG tube placement on 01/08/2019 at [facility] secondary to chronic respiratory failure requiring prolonged intubation.  We are asked to evaluate the tube due to leaking around the stoma site.  I tightened the bumper against the abdomen, hopefully this will resolve the issue.  Nurse aware to monitor for continued leakage.  If leaking continues, stop tube feeds and we will exchange tube in IR tomorrow.  (I was unable to locate any documentation regarding size of gastrostomy tube placed).  WENDY S BLAIR PA-C 01/31/2021 11:43 AM

## 2021-02-01 DIAGNOSIS — N183 Chronic kidney disease, stage 3 unspecified: Secondary | ICD-10-CM | POA: Diagnosis not present

## 2021-02-01 DIAGNOSIS — J9621 Acute and chronic respiratory failure with hypoxia: Secondary | ICD-10-CM | POA: Diagnosis not present

## 2021-02-01 DIAGNOSIS — J449 Chronic obstructive pulmonary disease, unspecified: Secondary | ICD-10-CM | POA: Diagnosis not present

## 2021-02-01 LAB — BLOOD GAS, ARTERIAL
Acid-Base Excess: 6.1 mmol/L — ABNORMAL HIGH (ref 0.0–2.0)
Bicarbonate: 30.5 mmol/L — ABNORMAL HIGH (ref 20.0–28.0)
FIO2: 35
O2 Saturation: 94.2 %
Patient temperature: 36.8
pCO2 arterial: 46.9 mmHg (ref 32.0–48.0)
pH, Arterial: 7.427 (ref 7.350–7.450)
pO2, Arterial: 71.7 mmHg — ABNORMAL LOW (ref 83.0–108.0)

## 2021-02-01 NOTE — Progress Notes (Signed)
    Spoke to RN this am Since IR evaluation 3/27-and "cinching of tube", leaking has diminished nicely. Some small leak at tube site- but minimal; ok for now RN says they will let us know if need IR to exchange Will replace order if need.

## 2021-02-01 NOTE — Progress Notes (Signed)
Pulmonary Critical Care Medicine [facility] [place]   PULMONARY CRITICAL CARE SERVICE  PROGRESS NOTE     Katie Wade  NGE:[id number]  DOB: [birth year]   DOA: 01/21/2021  Referring Physician: Carron Curie, MD  HPI: Katie Wade is a 87 y.o. female seen for follow up of Acute on Chronic Respiratory Failure.  Patient is on T collar on 35% FiO2 today will be completing 48 hours  Medications: Reviewed on Rounds  Physical Exam:  Vitals: Temperature is 98.2 pulse 96 respiratory rate 27 blood pressure 143/54 saturations 97%  Ventilator Settings on T collar  . General: Comfortable at this time . Eyes: Grossly normal lids, irises & conjunctiva . ENT: grossly tongue is normal . Neck: no obvious mass . Cardiovascular: S1 S2 normal no gallop . Respiratory: Scattered rhonchi expansion is equal at this time . Abdomen: soft . Skin: no rash seen on limited exam . Musculoskeletal: not rigid . Psychiatric:unable to assess . Neurologic: no seizure no involuntary movements         Lab Data:   Basic Metabolic Panel: Recent Labs  Lab 01/28/21 0400  NA 142  K 4.5  CL 99  CO2 38*  GLUCOSE 99  BUN 41*  CREATININE 0.51  CALCIUM 10.8*  MG 1.9    ABG: Recent Labs  Lab 02/01/21 0850  PHART 7.427  PCO2ART 46.9  PO2ART 71.7*  HCO3 30.5*  O2SAT 94.2    Liver Function Tests: No results for input(s): AST, ALT, ALKPHOS, BILITOT, PROT, ALBUMIN in the last 168 hours. No results for input(s): LIPASE, AMYLASE in the last 168 hours. No results for input(s): AMMONIA in the last 168 hours.  CBC: Recent Labs  Lab 01/28/21 0400  WBC 6.7  HGB 9.8*  HCT 31.6*  MCV 91.9  PLT 205    Cardiac Enzymes: No results for input(s): CKTOTAL, CKMB, CKMBINDEX, TROPONINI in the last 168 hours.  BNP (last 3 results) No results for input(s): BNP in the last 8760 hours.  ProBNP (last 3 results) No results for input(s): PROBNP in the last 8760 hours.  Radiological  Exams: No results found.  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 Acute on chronic respiratory failure with hypoxia on T collar 35% FiO2 goal of 48 hours 2. Traumatic subdural hematoma overall no change 3. Severe COPD medical management 4. Chronic kidney disease stage III patient is at baseline we will follow the patient's lab work   I have personally seen and evaluated the patient, evaluated laboratory and imaging results, formulated the assessment and plan and placed orders. The Patient requires high complexity decision making with multiple systems involvement.  Rounds were done with the Respiratory Therapy Director and Staff therapists and discussed with nursing staff also.  Yevonne Pax, MD [facility] Pulmonary Critical Care Medicine Sleep Medicine

## 2021-02-02 DIAGNOSIS — N183 Chronic kidney disease, stage 3 unspecified: Secondary | ICD-10-CM | POA: Diagnosis not present

## 2021-02-02 DIAGNOSIS — J449 Chronic obstructive pulmonary disease, unspecified: Secondary | ICD-10-CM | POA: Diagnosis not present

## 2021-02-02 DIAGNOSIS — J9621 Acute and chronic respiratory failure with hypoxia: Secondary | ICD-10-CM | POA: Diagnosis not present

## 2021-02-02 NOTE — Progress Notes (Signed)
Pulmonary Critical Care Medicine [facility] [place]   PULMONARY CRITICAL CARE SERVICE  PROGRESS NOTE     Katie Wade  HMC:[id number]  DOB: 1933/07/23   DOA: 01/21/2021  Referring Physician: Carron Curie, MD  HPI: Katie Wade is a 87 y.o. female seen for follow up of Acute on Chronic Respiratory Failure.  Patient is on T collar on 28% FiO2 has been off now for more than 48 hours  Medications: Reviewed on Rounds  Physical Exam:  Vitals: Temperature is 97.8 pulse 101 respiratory rate is 22 blood pressure is 121/68 saturations 100%  Ventilator Settings off the ventilator on 28% T collar  . General: Comfortable at this time . Eyes: Grossly normal lids, irises & conjunctiva . ENT: grossly tongue is normal . Neck: no obvious mass . Cardiovascular: S1 S2 normal no gallop . Respiratory: Scattered rhonchi expansion is equal at this time . Abdomen: soft . Skin: no rash seen on limited exam . Musculoskeletal: not rigid . Psychiatric:unable to assess . Neurologic: no seizure no involuntary movements         Lab Data:   Basic Metabolic Panel: Recent Labs  Lab 01/28/21 0400  NA 142  K 4.5  CL 99  CO2 38*  GLUCOSE 99  BUN 41*  CREATININE 0.51  CALCIUM 10.8*  MG 1.9    ABG: Recent Labs  Lab 02/01/21 0850  PHART 7.427  PCO2ART 46.9  PO2ART 71.7*  HCO3 30.5*  O2SAT 94.2    Liver Function Tests: No results for input(s): AST, ALT, ALKPHOS, BILITOT, PROT, ALBUMIN in the last 168 hours. No results for input(s): LIPASE, AMYLASE in the last 168 hours. No results for input(s): AMMONIA in the last 168 hours.  CBC: Recent Labs  Lab 01/28/21 0400  WBC 6.7  HGB 9.8*  HCT 31.6*  MCV 91.9  PLT 205    Cardiac Enzymes: No results for input(s): CKTOTAL, CKMB, CKMBINDEX, TROPONINI in the last 168 hours.  BNP (last 3 results) No results for input(s): BNP in the last 8760 hours.  ProBNP (last 3 results) No results for input(s): PROBNP in the  last 8760 hours.  Radiological Exams: No results found.  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 Acute on chronic respiratory failure hypoxia plan is going to ED advance to T collar as tolerated we will continue with secretion management supportive care 2. Traumatic subdural hematoma no change we will continue to follow along. 3. Severe COPD medical management 4. Chronic kidney disease stage III patient is at baseline   I have personally seen and evaluated the patient, evaluated laboratory and imaging results, formulated the assessment and plan and placed orders. The Patient requires high complexity decision making with multiple systems involvement.  Rounds were done with the Respiratory Therapy Director and Staff therapists and discussed with nursing staff also.  Yevonne Pax, MD [facility] Pulmonary Critical Care Medicine Sleep Medicine

## 2021-02-03 DIAGNOSIS — J9621 Acute and chronic respiratory failure with hypoxia: Secondary | ICD-10-CM | POA: Diagnosis not present

## 2021-02-03 DIAGNOSIS — J449 Chronic obstructive pulmonary disease, unspecified: Secondary | ICD-10-CM | POA: Diagnosis not present

## 2021-02-03 DIAGNOSIS — N183 Chronic kidney disease, stage 3 unspecified: Secondary | ICD-10-CM | POA: Diagnosis not present

## 2021-02-03 NOTE — Progress Notes (Signed)
Pulmonary Critical Care Medicine [facility] [place]   PULMONARY CRITICAL CARE SERVICE  PROGRESS NOTE     Katie Wade  XKG:[id number]  DOB: Oct 31, 1933   DOA: 01/21/2021  Referring Physician: Carron Curie, MD  HPI: Katie Wade is a 87 y.o. female seen for follow up of Acute on Chronic Respiratory Failure.  Patient is comfortable right now without distress has no fevers noted remains off the ventilator  Medications: Reviewed on Rounds  Physical Exam:  Vitals: Temperature is 98.1 pulse 103 respiratory rate is 36 blood pressure is 166/89 saturations 96%  Ventilator Settings on T collar FiO2 28%  . General: Comfortable at this time . Eyes: Grossly normal lids, irises & conjunctiva . ENT: grossly tongue is normal . Neck: no obvious mass . Cardiovascular: S1 S2 normal no gallop . Respiratory: Scattered rhonchi coarse breath sounds . Abdomen: soft . Skin: no rash seen on limited exam . Musculoskeletal: not rigid . Psychiatric:unable to assess . Neurologic: no seizure no involuntary movements         Lab Data:   Basic Metabolic Panel: Recent Labs  Lab 01/28/21 0400  NA 142  K 4.5  CL 99  CO2 38*  GLUCOSE 99  BUN 41*  CREATININE 0.51  CALCIUM 10.8*  MG 1.9    ABG: Recent Labs  Lab 02/01/21 0850  PHART 7.427  PCO2ART 46.9  PO2ART 71.7*  HCO3 30.5*  O2SAT 94.2    Liver Function Tests: No results for input(s): AST, ALT, ALKPHOS, BILITOT, PROT, ALBUMIN in the last 168 hours. No results for input(s): LIPASE, AMYLASE in the last 168 hours. No results for input(s): AMMONIA in the last 168 hours.  CBC: Recent Labs  Lab 01/28/21 0400  WBC 6.7  HGB 9.8*  HCT 31.6*  MCV 91.9  PLT 205    Cardiac Enzymes: No results for input(s): CKTOTAL, CKMB, CKMBINDEX, TROPONINI in the last 168 hours.  BNP (last 3 results) No results for input(s): BNP in the last 8760 hours.  ProBNP (last 3 results) No results for input(s): PROBNP in the last  8760 hours.  Radiological Exams: No results found.  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 Acute on chronic respiratory failure hypoxia we will continue with T-piece titrate oxygen continue pulmonary toilet. 2. Traumatic subdural dural hematoma no change we will continue to monitor. 3. Severe COPD medical management 4. Chronic kidney disease stage III supportive care   I have personally seen and evaluated the patient, evaluated laboratory and imaging results, formulated the assessment and plan and placed orders. The Patient requires high complexity decision making with multiple systems involvement.  Rounds were done with the Respiratory Therapy Director and Staff therapists and discussed with nursing staff also.  Yevonne Pax, MD [facility] Pulmonary Critical Care Medicine Sleep Medicine

## 2021-02-04 DIAGNOSIS — J449 Chronic obstructive pulmonary disease, unspecified: Secondary | ICD-10-CM | POA: Diagnosis not present

## 2021-02-04 DIAGNOSIS — J9621 Acute and chronic respiratory failure with hypoxia: Secondary | ICD-10-CM | POA: Diagnosis not present

## 2021-02-04 DIAGNOSIS — N183 Chronic kidney disease, stage 3 unspecified: Secondary | ICD-10-CM | POA: Diagnosis not present

## 2021-02-04 LAB — CBC
HCT: 34.3 % — ABNORMAL LOW (ref 36.0–46.0)
Hemoglobin: 11.2 g/dL — ABNORMAL LOW (ref 12.0–15.0)
MCH: 28.9 pg (ref 26.0–34.0)
MCHC: 32.7 g/dL (ref 30.0–36.0)
MCV: 88.6 fL (ref 80.0–100.0)
Platelets: ADEQUATE K/uL (ref 150–400)
RBC: 3.87 MIL/uL (ref 3.87–5.11)
RDW: 15.2 % (ref 11.5–15.5)
WBC: 9.9 K/uL (ref 4.0–10.5)
nRBC: 0 % (ref 0.0–0.2)

## 2021-02-04 LAB — BASIC METABOLIC PANEL
Anion gap: 11 (ref 5–15)
BUN: 49 mg/dL — ABNORMAL HIGH (ref 8–23)
CO2: 30 mmol/L (ref 22–32)
Calcium: 10.9 mg/dL — ABNORMAL HIGH (ref 8.9–10.3)
Chloride: 101 mmol/L (ref 98–111)
Creatinine, Ser: 0.65 mg/dL (ref 0.44–1.00)
GFR, Estimated: >60 mL/min (ref >60)
Glucose, Bld: 141 mg/dL — ABNORMAL HIGH (ref 70–99)
Potassium: 4.1 mmol/L (ref 3.5–5.1)
Sodium: 142 mmol/L (ref 135–145)

## 2021-02-04 NOTE — Progress Notes (Signed)
Pulmonary Critical Care Medicine [facility] [place]   PULMONARY CRITICAL CARE SERVICE  PROGRESS NOTE     CATHERYN SLIFER  GBT:[id number]  DOB: 1933/06/10   DOA: 01/21/2021  Referring Physician: Carron Curie, MD  HPI: Katie Wade is a 87 y.o. female seen for follow up of Acute on Chronic Respiratory Failure.  Patient is capping right now has been on nasal cannula doing well  Medications: Reviewed on Rounds  Physical Exam:  Vitals: Temperature is 97.6 pulse 101 respiratory 30 blood pressure is 174/82 saturations 94%  Ventilator Settings capping off the ventilator at this time  . General: Comfortable at this time . Eyes: Grossly normal lids, irises & conjunctiva . ENT: grossly tongue is normal . Neck: no obvious mass . Cardiovascular: S1 S2 normal no gallop . Respiratory: No rhonchi very coarse breath sounds . Abdomen: soft . Skin: no rash seen on limited exam . Musculoskeletal: not rigid . Psychiatric:unable to assess . Neurologic: no seizure no involuntary movements         Lab Data:   Basic Metabolic Panel: Recent Labs  Lab 02/04/21 0505  NA 142  K 4.1  CL 101  CO2 30  GLUCOSE 141*  BUN 49*  CREATININE 0.65  CALCIUM 10.9*    ABG: Recent Labs  Lab 02/01/21 0850  PHART 7.427  PCO2ART 46.9  PO2ART 71.7*  HCO3 30.5*  O2SAT 94.2    Liver Function Tests: No results for input(s): AST, ALT, ALKPHOS, BILITOT, PROT, ALBUMIN in the last 168 hours. No results for input(s): LIPASE, AMYLASE in the last 168 hours. No results for input(s): AMMONIA in the last 168 hours.  CBC: Recent Labs  Lab 02/04/21 0505  WBC 9.9  HGB 11.2*  HCT 34.3*  MCV 88.6  PLT PLATELET CLUMPS NOTED ON SMEAR, COUNT APPEARS ADEQUATE    Cardiac Enzymes: No results for input(s): CKTOTAL, CKMB, CKMBINDEX, TROPONINI in the last 168 hours.  BNP (last 3 results) No results for input(s): BNP in the last 8760 hours.  ProBNP (last 3 results) No results for  input(s): PROBNP in the last 8760 hours.  Radiological Exams: No results found.  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 Acute on chronic respiratory failure with hypoxia the patient will keep up with the capping for now.  Has some easy desaturations with mobility.  Will titrate the oxygen accordingly 2. Traumatic subdural hematoma no change supportive care 3. Severe COPD medical management 4. Chronic kidney disease stage III patient is at baseline   I have personally seen and evaluated the patient, evaluated laboratory and imaging results, formulated the assessment and plan and placed orders. The Patient requires high complexity decision making with multiple systems involvement.  Rounds were done with the Respiratory Therapy Director and Staff therapists and discussed with nursing staff also.  Yevonne Pax, MD [facility] Pulmonary Critical Care Medicine Sleep Medicine

## 2021-02-07 ENCOUNTER — Other Ambulatory Visit (HOSPITAL_COMMUNITY): Payer: Medicare Other

## 2021-02-07 LAB — CBC
HCT: 34.8 % — ABNORMAL LOW (ref 36.0–46.0)
Hemoglobin: 11.1 g/dL — ABNORMAL LOW (ref 12.0–15.0)
MCH: 28.8 pg (ref 26.0–34.0)
MCHC: 31.9 g/dL (ref 30.0–36.0)
MCV: 90.2 fL (ref 80.0–100.0)
Platelets: 262 10*3/uL (ref 150–400)
RBC: 3.86 MIL/uL — ABNORMAL LOW (ref 3.87–5.11)
RDW: 15.5 % (ref 11.5–15.5)
WBC: 11.7 10*3/uL — ABNORMAL HIGH (ref 4.0–10.5)
nRBC: 0 % (ref 0.0–0.2)

## 2021-02-07 LAB — BASIC METABOLIC PANEL
Anion gap: 11 (ref 5–15)
BUN: 60 mg/dL — ABNORMAL HIGH (ref 8–23)
CO2: 31 mmol/L (ref 22–32)
Calcium: 10.6 mg/dL — ABNORMAL HIGH (ref 8.9–10.3)
Chloride: 100 mmol/L (ref 98–111)
Creatinine, Ser: 0.67 mg/dL (ref 0.44–1.00)
GFR, Estimated: >60 mL/min (ref >60)
Glucose, Bld: 137 mg/dL — ABNORMAL HIGH (ref 70–99)
Potassium: 3.5 mmol/L (ref 3.5–5.1)
Sodium: 142 mmol/L (ref 135–145)

## 2021-02-08 LAB — CBC WITH DIFFERENTIAL/PLATELET
Abs Immature Granulocytes: 0.12 10*3/uL — ABNORMAL HIGH (ref 0.00–0.07)
Basophils Absolute: 0.1 10*3/uL (ref 0.0–0.1)
Basophils Relative: 1 %
Eosinophils Absolute: 0.2 10*3/uL (ref 0.0–0.5)
Eosinophils Relative: 2 %
HCT: 37.6 % (ref 36.0–46.0)
Hemoglobin: 11.7 g/dL — ABNORMAL LOW (ref 12.0–15.0)
Immature Granulocytes: 1 %
Lymphocytes Relative: 18 %
Lymphs Abs: 1.8 10*3/uL (ref 0.7–4.0)
MCH: 28.5 pg (ref 26.0–34.0)
MCHC: 31.1 g/dL (ref 30.0–36.0)
MCV: 91.5 fL (ref 80.0–100.0)
Monocytes Absolute: 1.3 10*3/uL — ABNORMAL HIGH (ref 0.1–1.0)
Monocytes Relative: 13 %
Neutro Abs: 6.4 10*3/uL (ref 1.7–7.7)
Neutrophils Relative %: 65 %
Platelets: 253 10*3/uL (ref 150–400)
RBC: 4.11 MIL/uL (ref 3.87–5.11)
RDW: 15.5 % (ref 11.5–15.5)
WBC: 9.9 10*3/uL (ref 4.0–10.5)
nRBC: 0 % (ref 0.0–0.2)

## 2021-02-10 ENCOUNTER — Other Ambulatory Visit (HOSPITAL_COMMUNITY): Payer: Medicare Other

## 2021-02-12 NOTE — Progress Notes (Signed)
IR consulted by Dr. Sharyon Medicus for management of leaking gastrostomy tube.  Gastrostomy tube placed at outside facility. Has had issues with leakage over the past few weeks- IR PA attempted to trouble shoot bedside 01/31/2021 with resolution of leakage, however tube continues to leak. Toniann Fail, PA from IR at bedside today to attempt to troubleshoot tube however continues to leak. At this point, recommend tube exchange/replacement- plan for image-guided gastrostomy tube exchange/replacement in IR tentatively for Monday 02/15/2021 pending IR scheduling. Patient will be NPO at midnight/tube feeds held prior to procedure (verbal order for this given to Dr. Sharyon Medicus). Verbal consent obtained from patient's grandson/POA Derrill Kay.  Please call IR with questions/concerns.   Waylan Boga Elnora Quizon, PA-C 02/12/2021, 12:00 PM

## 2021-02-13 LAB — CBC
HCT: 39 % (ref 36.0–46.0)
Hemoglobin: 12.2 g/dL (ref 12.0–15.0)
MCH: 27.9 pg (ref 26.0–34.0)
MCHC: 31.3 g/dL (ref 30.0–36.0)
MCV: 89.2 fL (ref 80.0–100.0)
Platelets: 306 10*3/uL (ref 150–400)
RBC: 4.37 MIL/uL (ref 3.87–5.11)
RDW: 16.1 % — ABNORMAL HIGH (ref 11.5–15.5)
WBC: 13.5 10*3/uL — ABNORMAL HIGH (ref 4.0–10.5)
nRBC: 0 % (ref 0.0–0.2)

## 2021-02-13 LAB — BASIC METABOLIC PANEL
Anion gap: 11 (ref 5–15)
BUN: 70 mg/dL — ABNORMAL HIGH (ref 8–23)
CO2: 28 mmol/L (ref 22–32)
Calcium: 10.3 mg/dL (ref 8.9–10.3)
Chloride: 108 mmol/L (ref 98–111)
Creatinine, Ser: 0.86 mg/dL (ref 0.44–1.00)
GFR, Estimated: >60 mL/min (ref >60)
Glucose, Bld: 135 mg/dL — ABNORMAL HIGH (ref 70–99)
Potassium: 4 mmol/L (ref 3.5–5.1)
Sodium: 147 mmol/L — ABNORMAL HIGH (ref 135–145)

## 2021-02-13 LAB — TSH: TSH: 1.216 u[IU]/mL (ref 0.350–4.500)

## 2021-02-16 ENCOUNTER — Other Ambulatory Visit (HOSPITAL_COMMUNITY): Payer: Medicare Other

## 2021-02-16 HISTORY — PX: IR FLUORO RM 30-60 MIN: IMG2384

## 2021-02-16 LAB — CBC
HCT: 39.7 % (ref 36.0–46.0)
Hemoglobin: 12.1 g/dL (ref 12.0–15.0)
MCH: 28.5 pg (ref 26.0–34.0)
MCHC: 30.5 g/dL (ref 30.0–36.0)
MCV: 93.4 fL (ref 80.0–100.0)
Platelets: 213 10*3/uL (ref 150–400)
RBC: 4.25 MIL/uL (ref 3.87–5.11)
RDW: 16.1 % — ABNORMAL HIGH (ref 11.5–15.5)
WBC: 10.6 10*3/uL — ABNORMAL HIGH (ref 4.0–10.5)
nRBC: 0 % (ref 0.0–0.2)

## 2021-02-16 LAB — BASIC METABOLIC PANEL
Anion gap: 11 (ref 5–15)
BUN: 66 mg/dL — ABNORMAL HIGH (ref 8–23)
CO2: 25 mmol/L (ref 22–32)
Calcium: 9.1 mg/dL (ref 8.9–10.3)
Chloride: 113 mmol/L — ABNORMAL HIGH (ref 98–111)
Creatinine, Ser: 0.9 mg/dL (ref 0.44–1.00)
GFR, Estimated: >60 mL/min (ref >60)
Glucose, Bld: 82 mg/dL (ref 70–99)
Potassium: 2.8 mmol/L — ABNORMAL LOW (ref 3.5–5.1)
Sodium: 149 mmol/L — ABNORMAL HIGH (ref 135–145)

## 2021-02-16 MED ORDER — LIDOCAINE VISCOUS HCL 2 % MT SOLN
OROMUCOSAL | Status: AC
  Filled 2021-02-16: qty 15

## 2021-02-17 ENCOUNTER — Other Ambulatory Visit (HOSPITAL_COMMUNITY): Payer: Medicare Other

## 2021-02-17 HISTORY — PX: IR REPLC GASTRO/COLONIC TUBE PERCUT W/FLUORO: IMG2333

## 2021-02-17 LAB — POTASSIUM: Potassium: 3.4 mmol/L — ABNORMAL LOW (ref 3.5–5.1)

## 2021-02-17 MED ORDER — LIDOCAINE VISCOUS HCL 2 % MT SOLN
OROMUCOSAL | Status: AC
  Filled 2021-02-17: qty 15

## 2021-02-17 MED ORDER — IOHEXOL 300 MG/ML  SOLN
50.0000 mL | Freq: Once | INTRAMUSCULAR | Status: AC | PRN
  Administered 2021-02-17: 50 mL

## 2021-02-17 NOTE — Procedures (Signed)
Interventional Radiology Procedure Note  Procedure:   Injection of gastrostomy tube, with history of "leaking"  Placement of new 42F gastrostomy, balloon retention.   Findings:  The old gtube was withdrawn from the stomach, with injected contrast in the superficial tissues.  .  Complications: None Recommendations:  - OK to use - Do not submerge - Routine care   Signed,  Yvone Neu. Loreta Ave, DO

## 2021-02-17 NOTE — Progress Notes (Signed)
Evaluation after IV Infiltrate  Patient to IR from [facility] for image-guided gastrostomy tube exchange. Upon arrival, Clydie Braun, [place] noted that patient had possible IV infiltrate of RUE forearm. D5 was actively running- this was stopped by [facility] RN, right forearm IV removed by Clydie Braun, RN (IR). Unclear how much D5 was infiltrated (1000 mL bag, approximately 300 mL left when stopped).  Exam: There is diffuse palpable swelling of right forearm (approximately whole forearm).  There is no erythema. There is no discoloration (there is a bruise near [facility] fossa, not near IV site). There are no blisters. There are no signs of decreased perfusion of the skin.  It is warm to touch.  The patient has normal ROM in fingers.  Patient denies numbness/tingling of upper extremities. Radial pulse palpable bilaterally.  Per contrast extravasation protocol, I have instructed the patient to keep an ice pack on the area for 20-60 minutes at a time for about 48 hours. Keep arm elevated as much as possible.   Dr. Sharyon Medicus made aware. Please call IR with questions/concerns.   Madysin Crisp PA-C 02/17/2021 2:22 PM

## 2021-02-17 NOTE — Progress Notes (Signed)
Patient brought down with 5E RN to find the patients Left 22G IV to have infiltrated.  RN made aware that IV fluids (Dextrose) needed to be stopped due to patients IV infiltration and IV to be removed.  PA Ally made aware of the IV and came to assess.  From observation of the amount of fluid from the bag remaining it appeared the IV infiltrated approximately 500-637mL of fluids in to the patients arm.  Patient was able to move the left arm and not complaining of pain.  MD Loreta Ave made aware of the patients arm as well prior to having the GTube exchanged.

## 2021-02-18 ENCOUNTER — Other Ambulatory Visit (HOSPITAL_COMMUNITY): Payer: Medicare Other

## 2021-02-18 LAB — CBC
HCT: 34.9 % — ABNORMAL LOW (ref 36.0–46.0)
Hemoglobin: 11 g/dL — ABNORMAL LOW (ref 12.0–15.0)
MCH: 28.9 pg (ref 26.0–34.0)
MCHC: 31.5 g/dL (ref 30.0–36.0)
MCV: 91.8 fL (ref 80.0–100.0)
Platelets: 201 10*3/uL (ref 150–400)
RBC: 3.8 MIL/uL — ABNORMAL LOW (ref 3.87–5.11)
RDW: 15.8 % — ABNORMAL HIGH (ref 11.5–15.5)
WBC: 9.6 10*3/uL (ref 4.0–10.5)
nRBC: 0 % (ref 0.0–0.2)

## 2021-02-18 LAB — BASIC METABOLIC PANEL
Anion gap: 9 (ref 5–15)
BUN: 28 mg/dL — ABNORMAL HIGH (ref 8–23)
CO2: 27 mmol/L (ref 22–32)
Calcium: 9.2 mg/dL (ref 8.9–10.3)
Chloride: 107 mmol/L (ref 98–111)
Creatinine, Ser: 0.69 mg/dL (ref 0.44–1.00)
GFR, Estimated: >60 mL/min (ref >60)
Glucose, Bld: 87 mg/dL (ref 70–99)
Potassium: 3 mmol/L — ABNORMAL LOW (ref 3.5–5.1)
Sodium: 143 mmol/L (ref 135–145)

## 2021-02-18 LAB — CULTURE, BLOOD (ROUTINE X 2)
Culture: NO GROWTH
Culture: NO GROWTH

## 2021-02-20 LAB — POTASSIUM: Potassium: 3.1 mmol/L — ABNORMAL LOW (ref 3.5–5.1)

## 2021-02-21 LAB — POTASSIUM: Potassium: 3.7 mmol/L (ref 3.5–5.1)

## 2021-02-21 LAB — SARS CORONAVIRUS 2 (TAT 6-24 HRS): SARS Coronavirus 2: NEGATIVE

## 2022-11-25 IMAGING — DX DG CHEST 1V PORT
1 series · 1 of 1 positions shown · non-contrast
Comparison: None.

CLINICAL DATA: PICC line

EXAM:
PORTABLE CHEST 1 VIEW

[chest ap]
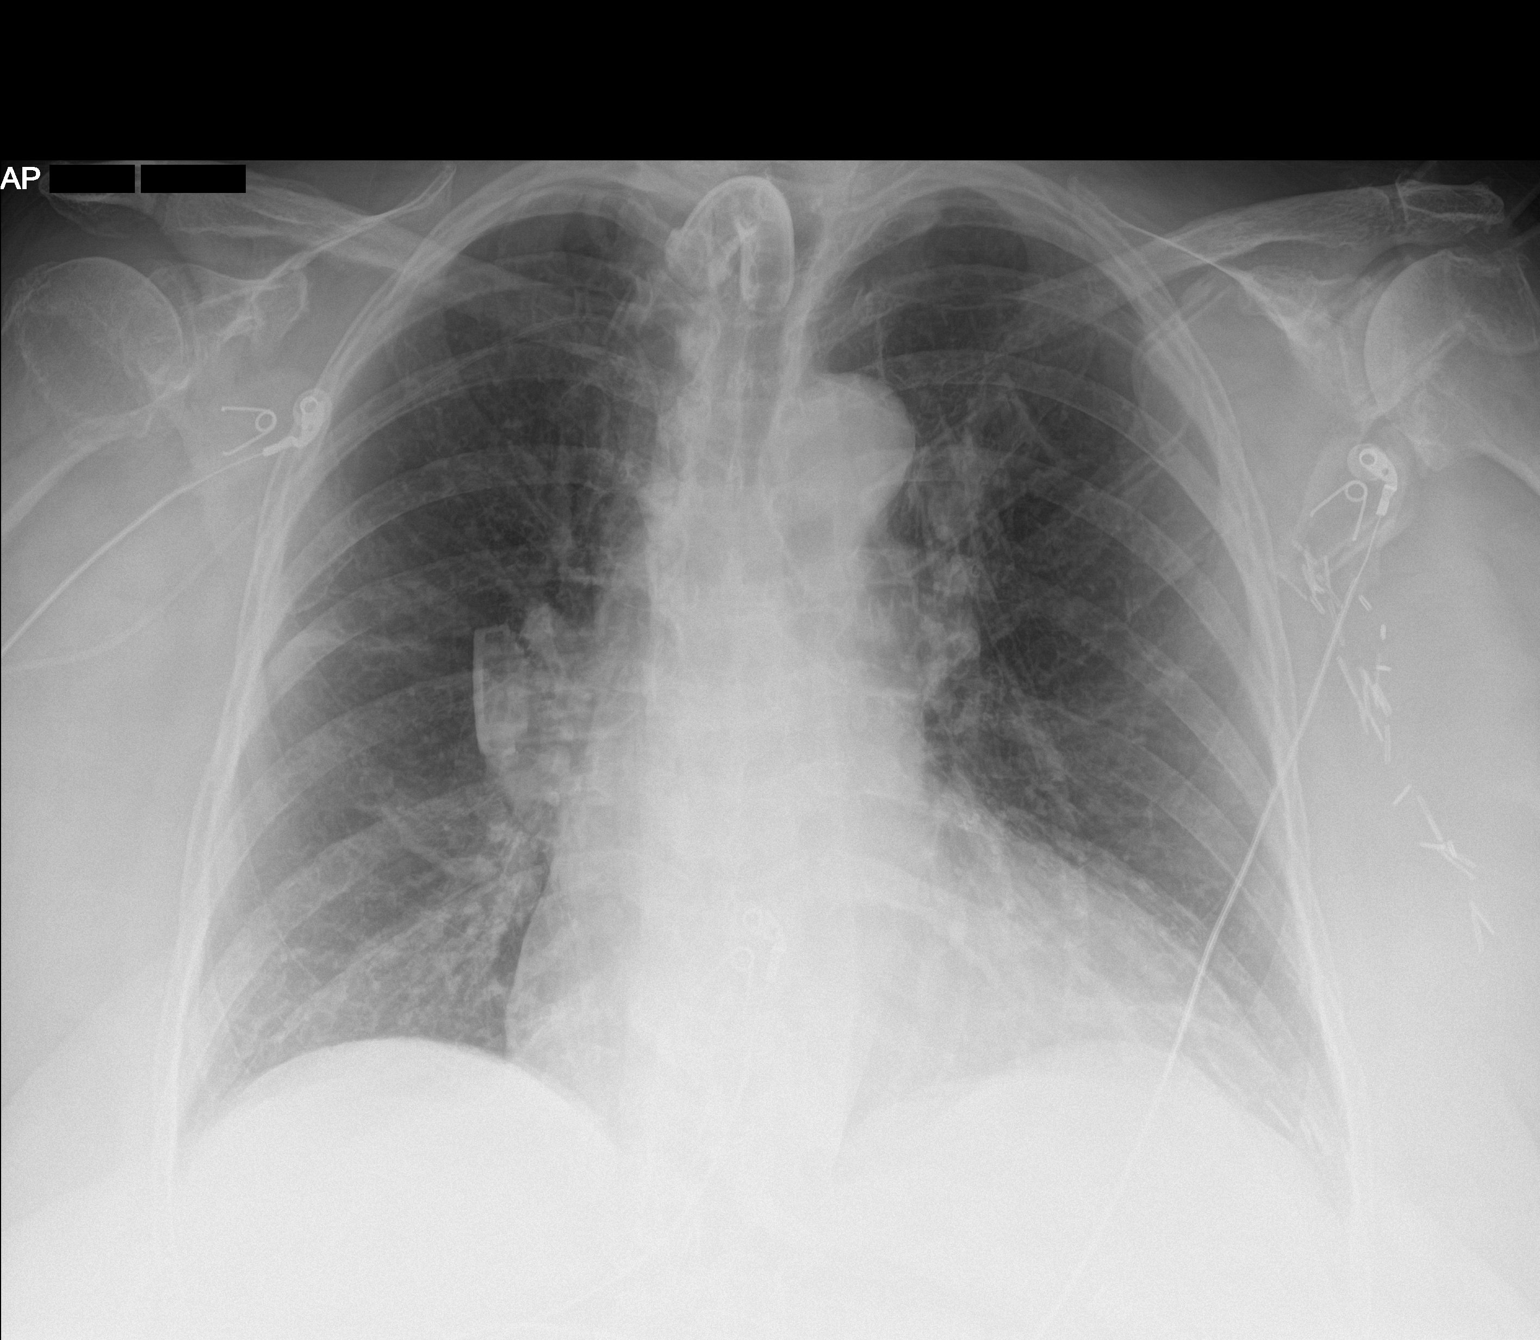

[1 of 1 positions shown; findings below may reference images not displayed]

FINDINGS: Single frontal view of the chest demonstrates tracheostomy tube
overlying tracheal air column tip at level of thoracic inlet. There
is a right-sided PICC tip overlying the superior vena cava. Cardiac
silhouette is unremarkable. No airspace disease, effusion, or
pneumothorax. Surgical clips left axilla.
IMPRESSION: 1. Right-sided PICC tip overlying superior vena cava.
2. No acute intrathoracic process.

## 2022-11-25 IMAGING — DX DG ABDOMEN 1V
1 series · 1 of 1 positions shown · non-contrast
Comparison: None.

CLINICAL DATA: Peg tube placement

EXAM:
ABDOMEN - 1 VIEW

[abdomen kub]
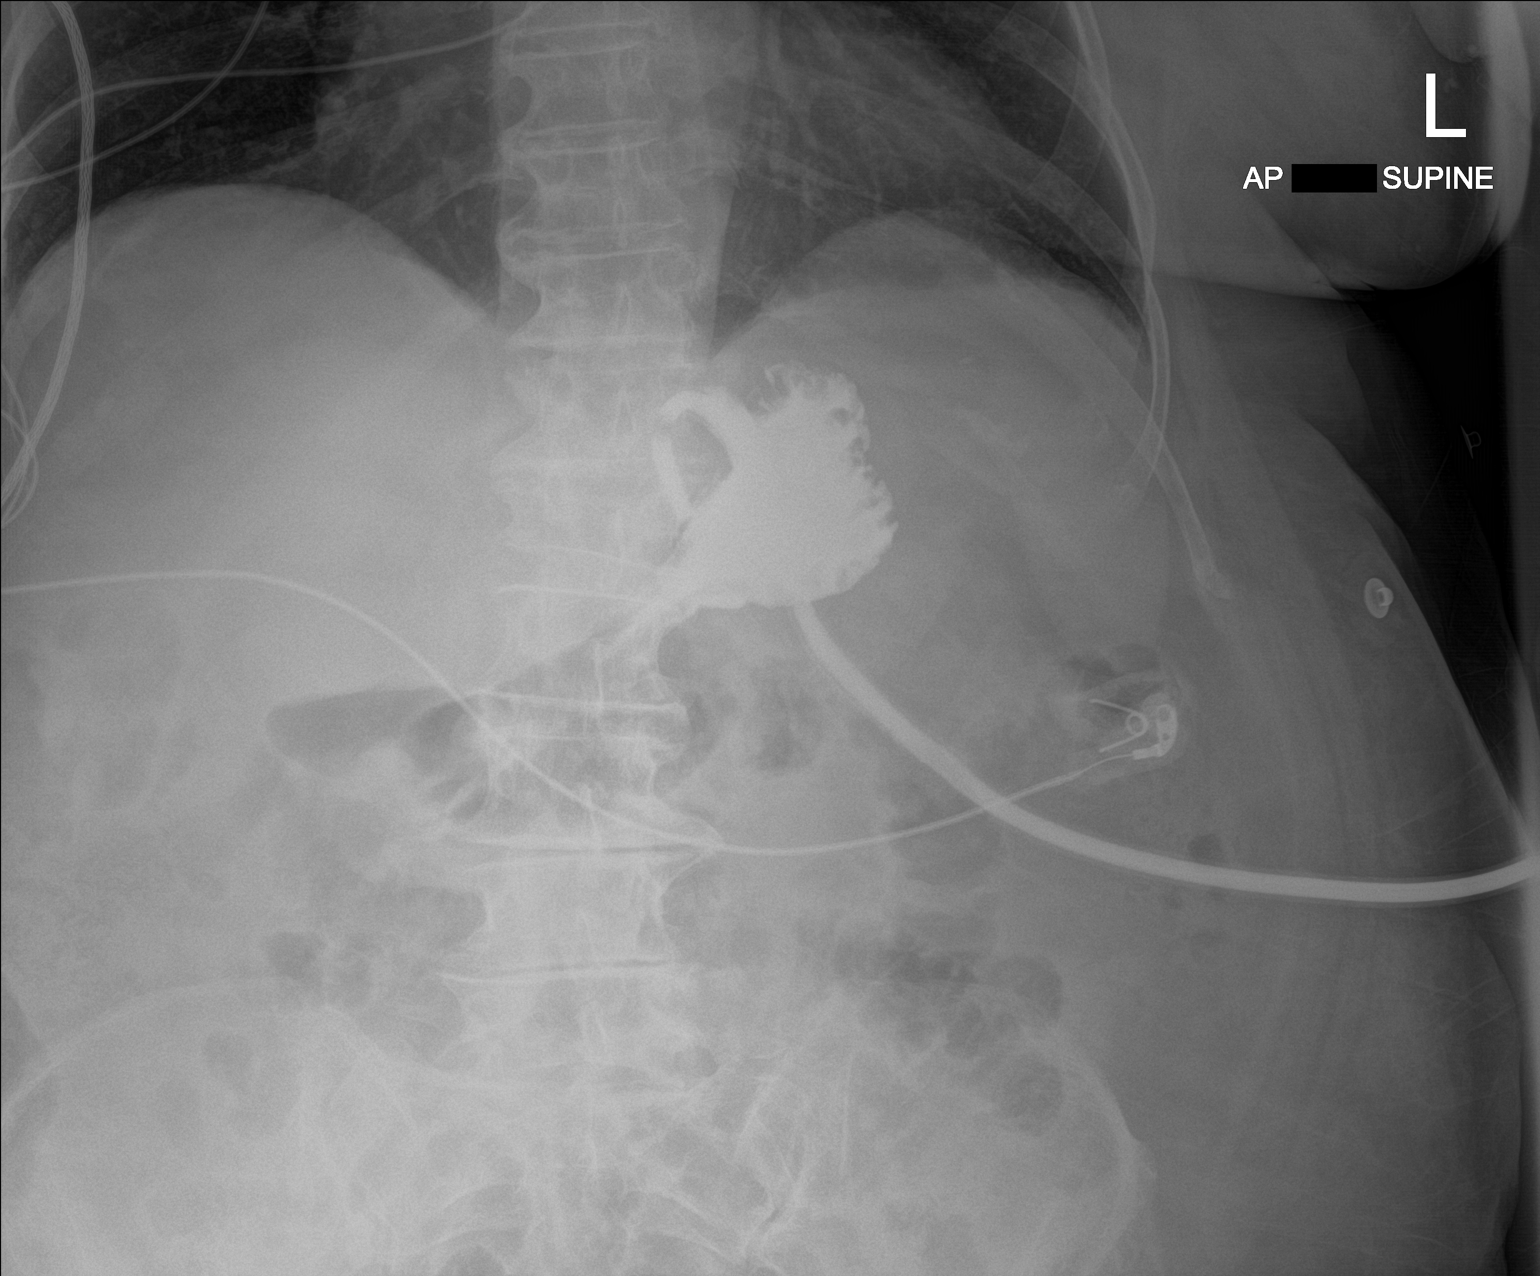

[1 of 1 positions shown; findings below may reference images not displayed]

FINDINGS: Frontal view of the lower chest and upper abdomen excludes the
pelvis and right flank by collimation. Percutaneous gastrostomy tube
overlies the left upper quadrant. Contrast administered by the
clinician outlines the catheter tubing and the gastric rugal folds
within the fundus. No evidence of contrast extravasation. No bowel
obstruction.
IMPRESSION: 1. Percutaneous gastrostomy tube within the gastric lumen. No
contrast extravasation.
